# Patient Record
Sex: Female | Born: 1965 | Race: White | Hispanic: No | Marital: Married | Smoking: Never smoker
Health system: Southern US, Community
[De-identification: ages and names within clinical notes are randomized; demographics above are authoritative.]

## PROBLEM LIST (undated history)

## (undated) HISTORY — PX: ROTATOR CUFF REPAIR: SHX139

---

## 2003-01-02 ENCOUNTER — Other Ambulatory Visit: Admission: RE | Admit: 2003-01-02 | Discharge: 2003-01-02 | Payer: Self-pay | Admitting: Obstetrics and Gynecology

## 2003-07-13 ENCOUNTER — Inpatient Hospital Stay (HOSPITAL_COMMUNITY): Admission: RE | Admit: 2003-07-13 | Discharge: 2003-07-16 | Payer: Self-pay | Admitting: Obstetrics and Gynecology

## 2003-07-17 ENCOUNTER — Encounter: Admission: RE | Admit: 2003-07-17 | Discharge: 2003-08-16 | Payer: Self-pay | Admitting: Obstetrics and Gynecology

## 2003-08-21 ENCOUNTER — Other Ambulatory Visit: Admission: RE | Admit: 2003-08-21 | Discharge: 2003-08-21 | Payer: Self-pay | Admitting: Obstetrics and Gynecology

## 2005-04-27 ENCOUNTER — Other Ambulatory Visit: Admission: RE | Admit: 2005-04-27 | Discharge: 2005-04-27 | Payer: Self-pay | Admitting: Obstetrics and Gynecology

## 2005-11-06 ENCOUNTER — Inpatient Hospital Stay (HOSPITAL_COMMUNITY): Admission: RE | Admit: 2005-11-06 | Discharge: 2005-11-09 | Payer: Self-pay | Admitting: Obstetrics and Gynecology

## 2005-11-10 ENCOUNTER — Encounter: Admission: RE | Admit: 2005-11-10 | Discharge: 2005-12-10 | Payer: Self-pay | Admitting: Obstetrics and Gynecology

## 2005-12-11 ENCOUNTER — Encounter: Admission: RE | Admit: 2005-12-11 | Discharge: 2006-01-09 | Payer: Self-pay | Admitting: Obstetrics and Gynecology

## 2006-01-10 ENCOUNTER — Encounter: Admission: RE | Admit: 2006-01-10 | Discharge: 2006-01-30 | Payer: Self-pay | Admitting: Obstetrics and Gynecology

## 2006-06-02 ENCOUNTER — Encounter: Admission: RE | Admit: 2006-06-02 | Discharge: 2006-07-01 | Payer: Self-pay | Admitting: Family Medicine

## 2006-12-13 ENCOUNTER — Encounter (INDEPENDENT_AMBULATORY_CARE_PROVIDER_SITE_OTHER): Payer: Self-pay | Admitting: Obstetrics and Gynecology

## 2006-12-13 ENCOUNTER — Ambulatory Visit (HOSPITAL_COMMUNITY): Admission: AD | Admit: 2006-12-13 | Discharge: 2006-12-13 | Payer: Self-pay | Admitting: Obstetrics and Gynecology

## 2008-08-07 ENCOUNTER — Inpatient Hospital Stay (HOSPITAL_COMMUNITY): Admission: RE | Admit: 2008-08-07 | Discharge: 2008-08-10 | Payer: Self-pay | Admitting: Obstetrics and Gynecology

## 2008-08-07 ENCOUNTER — Encounter (INDEPENDENT_AMBULATORY_CARE_PROVIDER_SITE_OTHER): Payer: Self-pay | Admitting: Obstetrics and Gynecology

## 2008-08-11 ENCOUNTER — Encounter: Admission: RE | Admit: 2008-08-11 | Discharge: 2008-09-10 | Payer: Self-pay | Admitting: Obstetrics and Gynecology

## 2008-09-07 ENCOUNTER — Ambulatory Visit: Admission: RE | Admit: 2008-09-07 | Discharge: 2008-09-07 | Payer: Self-pay | Admitting: Obstetrics and Gynecology

## 2008-09-11 ENCOUNTER — Encounter: Admission: RE | Admit: 2008-09-11 | Discharge: 2008-10-10 | Payer: Self-pay | Admitting: Obstetrics and Gynecology

## 2008-10-11 ENCOUNTER — Encounter: Admission: RE | Admit: 2008-10-11 | Discharge: 2008-11-10 | Payer: Self-pay | Admitting: Obstetrics and Gynecology

## 2008-11-11 ENCOUNTER — Encounter: Admission: RE | Admit: 2008-11-11 | Discharge: 2008-12-11 | Payer: Self-pay | Admitting: Obstetrics and Gynecology

## 2008-12-12 ENCOUNTER — Encounter: Admission: RE | Admit: 2008-12-12 | Discharge: 2009-01-04 | Payer: Self-pay | Admitting: Obstetrics and Gynecology

## 2010-07-01 LAB — CBC
HCT: 24.8 % — ABNORMAL LOW (ref 36.0–46.0)
Hemoglobin: 8.6 g/dL — ABNORMAL LOW (ref 12.0–15.0)
Hemoglobin: 8.8 g/dL — ABNORMAL LOW (ref 12.0–15.0)
MCHC: 35.4 g/dL (ref 30.0–36.0)
MCV: 93.8 fL (ref 78.0–100.0)
Platelets: 201 10*3/uL (ref 150–400)
RBC: 2.6 MIL/uL — ABNORMAL LOW (ref 3.87–5.11)
RBC: 2.64 MIL/uL — ABNORMAL LOW (ref 3.87–5.11)
RBC: 3.63 MIL/uL — ABNORMAL LOW (ref 3.87–5.11)
WBC: 8.3 10*3/uL (ref 4.0–10.5)
WBC: 9.3 10*3/uL (ref 4.0–10.5)

## 2010-07-01 LAB — RPR: RPR Ser Ql: NONREACTIVE

## 2010-08-05 NOTE — H&P (Signed)
[name], [name]                ACCOUNT NO.:  [id number]   MEDICAL RECORD NO.:  [id number]          PATIENT TYPE:  MAT   LOCATION:  [facility]                          FACILITY:  [facility]   PHYSICIAN:  Dineen Kid. Rana Snare, M.D.    DATE OF BIRTH:  [birth year]   DATE OF ADMISSION:  08/07/2008  DATE OF DISCHARGE:                              HISTORY & PHYSICAL   HISTORY OF PRESENT ILLNESS:  Stacey Solis is a 45 year old G4, P2, at 3+  weeks gestational age who presents for repeat cesarean section.  Her  estimated date of confinement is Aug 10, 2008.  Her pregnancy has been  complicated by advanced maternal age with normal chorionic villus  sampling.  She has had previous cesarean sections x2.  She desires  repeat cesarean section and tubal ligation.   PAST MEDICAL HISTORY:  Significant for history of cholestasis of  previous pregnancies.   PAST SURGICAL HISTORY:  She has had 2 cesarean sections, an inguinal  hernia repair, repair of her collar bone.   MEDICATIONS:  Prenatal vitamins.   She has no known drug allergies.   LABORATORY EVALUATION:  Normal.   PHYSICAL EXAMINATION:  VITAL SIGNS:  Her blood pressure is 100/60.  HEART:  Regular rate and rhythm.  LUNGS:  Clear to auscultation bilaterally.  ABDOMEN:  Gravid, nontender.  PELVIC:  Cervix is closed, thick and high.  Fetal heart tones are in the  150s.   Ultrasound showed estimated fetal weight of 7 pounds 0 ounces on Jul 26, 2008.   IMPRESSION:  Intrauterine pregnancy at 39+ weeks gestational age,  previous cesarean section, desires repeat.   PLAN:  Repeat low segment transverse cesarean section and bilateral  tubal ligation.  We discussed the risks and benefits of the procedure at  length which include but are not limited to risk of infection, bleeding,  damage to uterus, tubes, ovary, bowel, bladder, fetus, risk of tubal  failure quoted at 07/998.  She gives her informed consent and wishes to  proceed.      Dineen Kid Rana Snare,  M.D.  Electronically Signed     DCL/MEDQ  D:  08/06/2008  T:  08/06/2008  Job:  [id number]

## 2010-08-05 NOTE — Op Note (Signed)
Stacey Solis, Stacey Solis                ACCOUNT NO.:  [id number]   MEDICAL RECORD NO.:  [id number]          PATIENT TYPE:  AMB   LOCATION:  [facility]                          [facility]   PHYSICIAN:  Dineen Kid. Rana Snare, M.D.    DATE OF BIRTH:  [birth year]   DATE OF PROCEDURE:  12/13/2006  DATE OF DISCHARGE:                               OPERATIVE REPORT   PREOPERATIVE DIAGNOSIS:  Intrauterine pregnancy at [redacted] weeks gestational  age with embryonic demise.   POSTOPERATIVE DIAGNOSIS:  Intrauterine pregnancy at [redacted] weeks gestational  age with embryonic demise.   PROCEDURE:  Dilatation and evacuation   SURGEON:  Dineen Kid. Rana Snare, M.D.   ANESTHESIA:  Monitored anesthetic care and paracervical block.   INDICATIONS:  Stacey Solis is a 45 year old G3, P2, at 10-1/[redacted] weeks  gestational age, presents with vaginal bleeding.  Ultrasound shows a 6-  1/2-week embryo with demise.  She desires dilation and evacuation.  She  has been having cramping and bleeding and cervix is dilated to a  fingertip.  Risks and benefits for D and E were discussed at length.  Informed consent was obtained.  She is Rh positive.   DESCRIPTION OF PROCEDURE:  After adequate anesthesia, the patient was  placed in dorsal lithotomy position. She was sterilely prepped and  draped, the bladder was sterilely drained.  Speculum was placed,  tenaculum placed on the anterior lip of the cervix, paracervical block  was placed with 1% Xylocaine and 1:100,000 epinephrine.  A total of 20  mL used.  Uterus was sounded to 10 cm.  The cervix was already  adequately dilated, so an 8-mm suction curette was inserted, products of  conception retrieved, this was performed until a gritty surface was felt  of the endometrial cavity.  Methergine 0.2 mg was given IM with good  general response.  The curette was removed.  A small intracervical  bleeder as noted.  Direct pressure with a sponge forceps was used until  hemostasis was achieved.  Tenaculum was then  removed from the cervix,  noted to be hemostatic.  Speculum was then removed.  The patient was  transferred to the recovery room in stable condition.  Sponge and  instrument count was normal x3.  Estimated blood loss was minimal. The  patient received 30 mg of Toradol postoperatively.   DISPOSITION:  The patient will be discharged home, will follow up in the  office in 2-3 weeks.  Sent home with a routine instruction sheet for D  and C.  Told to return for increased pain, fever, or bleeding.  She was  given a prescription for Methergine 0.2 mg to take 3 times daily for 2  days and doxycycline 100 mg p.o. b.i.d. for 7 days.      Dineen Kid Rana Snare, M.D.  Electronically Signed     DCL/MEDQ  D:  12/13/2006  T:  12/14/2006  Job:  [id number]

## 2010-08-05 NOTE — Op Note (Signed)
Stacey Solis, Stacey Solis                ACCOUNT NO.:  [id number]   MEDICAL RECORD NO.:  [id number]          PATIENT TYPE:  INP   LOCATION:  9117                          [facility]ACILITY:  [facility]   PHYSICIAN:  Dineen Kid. Rana Snare, M.D.    DATE OF BIRTH:  1965-10-25   DATE OF PROCEDURE:  08/07/2008  DATE OF DISCHARGE:                               OPERATIVE REPORT   PREOPERATIVE DIAGNOSES:  1. Intrauterine pregnancy at 39-1/2 gestational age.  2. Previous cesarean section x2 with desired repeat and multiparity      with desired sterility.   POSTOPERATIVE DIAGNOSES:  1. Intrauterine pregnancy at 39-1/2 gestational age.  2. Previous cesarean section x2 with desired repeat and multiparity      with desired sterility.   PROCEDURE:  Repeat low segment transverse cesarean section and bilateral  tubal ligation.   SURGEON:  Dineen Kid. Rana Snare, M.D.   ANESTHESIA:  Spinal.   INDICATIONS:  Ms. Lehrman is 45 year old G4, P2 at 44 weeks, estimated  date of confinement is Jul 31, 2008.  Pregnancy has been complicated by  advanced maternal age with normal chorionic villus sampling.  She also  has multiparity, desires tubal ligation, presents for this today.  The  risks and benefits were discussed and informed consent was obtained.   FINDING AT THE TIME OF SURGERY:  Viable female infant, Apgars were 8 and  9, pH arterial 7.32, weight is 7 pounds 5 ounces.   DESCRIPTION OF PROCEDURE:  After adequate analgesia, the patient was  placed in the supine position left lateral tilt.  She is sterilely  prepped and draped.  Bladder sterilely drained with Foley catheter.  The  previous Pfannenstiel skin incision was elliptically excised.  The  incision was then taken down sharply to fascia, which was incised  transversely, extended superiorly and inferiorly off the bellies rectus  muscle were separated sharply in midline.  Peritoneum was entered  sharply.  Bladder flap created and placed behind the bladder blade.  A  low  segment myotomy incision made down to the infant's vertex.  Infant's  vertex was delivered atraumatically.  The nares and pharynx are  suctioned.  The infant was delivered, cord clamped, cut and handed to  the pediatricians for resuscitation.  Cord blood was obtained.  Placenta  extracted manually.  Uterus was exteriorized, wiped clean with a dry  lap.  The myotomy incision closed in 2 layers, first being running  locking and second being imbricating layer of 0 Monocryl suture.  The  right fallopian tube was identified by the fimbriated end.  A 2 suture  of 0 plain passed through the mesosalpinx, it was doubly ligated.  Central portion of the fallopian tube excised.  A 2-0 silk was then  ligated on the proximal portion of fallopian tube.  The left fallopian  tube was identified by the fimbriated end.  Midportion of the fallopian  tube ligated with 2 sutures of  0 plain suture passed through the  mesosalpinx, central portion excised, the proximal portion was then  ligated with 2-0 silk.  Good hemostasis was achieved.  The  uterus was  placed back into peritoneal cavity and after copious amount of  irrigation, adequate hemostasis was assured.  The patient has desired  plication of the fascia at the base of the umbilicus.  The umbilicus was  grasped with a Allis clamp through the peritoneum and fascia.  A figure-  of-eight of 0 Vicryl was used to plicate this portion of the fascia with  good results noted at the base of umbilicus.  Sutures then cut.  The  peritoneum was then closed with 0 Monocryl suture in a running fashion.  The fascia was then closed with 0 Vicryl in a running fashion with good  approximation and good hemostasis achieved.  Irrigation was applied.  After adequate hemostasis, the Scarpa's fascia and subcuticular tissue  was closed with a 2-0 plain suture.  Skin was then stapled, Steri-Strips  applied.  The patient tolerated the procedure well, was stable, and  transferred to  the recovery room.  Sponge and instrument count was  normal x3.  Estimated blood loss was 750 mL.  The patient received 1 g  of cefotetan preoperatively.      Dineen Kid Rana Snare, M.D.  Electronically Signed     DCL/MEDQ  D:  08/07/2008  T:  08/07/2008  Job:  [id number]

## 2010-08-05 NOTE — Discharge Summary (Signed)
NAMECORTNY, BAMBACH                ACCOUNT NO.:  [id number]   MEDICAL RECORD NO.:  [id number]          PATIENT TYPE:  INP   LOCATION:  9117                          [facility]   PHYSICIAN:  Julio Sicks, N.P.     DATE OF BIRTH:  November 07, 1965   DATE OF ADMISSION:  08/07/2008  DATE OF DISCHARGE:  08/10/2008                               DISCHARGE SUMMARY   ADMITTING DIAGNOSES:  1. Intrauterine pregnancy at 39-1/2 weeks' estimated gestational age.  2. Previous cesarean section x2, desires repeat.  3. Multiparity, desires permanent sterilization.   DISCHARGE DIAGNOSES:  1. Status post low transverse cesarean section.  2. Viable female infant.  3. Permanent sterilization.   PROCEDURES:  1. Repeat low transverse cesarean section.  2. Bilateral tubal ligation.   REASON FOR ADMISSION:  Please see dictated H and P.   HOSPITAL COURSE:  The patient is 45 year old gravida 4, para 2 that was  admitted to [facility] at 39-1/2 weeks' estimated  gestational age for a scheduled cesarean section.  The patient had had 2  previous cesarean deliveries and desired repeat.  Due to multiparity,  the patient also requested permanent sterilization.  On the morning of  admission, the patient was taken to operating room where spinal  anesthesia was administered without difficulty.  A low transverse  incision was made, delivery of a viable female infant, weighing 7 pounds  5 ounces with Apgars of 8 at 1-minute and 9 at 5-minute.  Arterial cord  pH of 7.32.  Bilateral tubal ligation was performed without difficulty.  The patient tolerated the procedure well and was taken to the recovery  room in stable condition.  Later that evening, the patient was  transferred to the Mother Baby Unit.  Approximately 8 o'clock that  evening, the patient did experience some low blood pressure.  The  patient was alert and oriented.  Blood pressure was 80-90/50-60.  Fundus  was firm and nontender.   Laboratory findings showed hemoglobin of 8.6.  On postoperative day #2, the patient was without complaint.  Vital signs  were stable.  Blood pressure was 83/56-89/48.  Abdomen was soft.  Fundus  was firm and nontender.  Abdominal dressing was noted to have a small  amount of drainage noted on the bandage.  Foley had been discontinued  and she was voiding well.  She is ambulating without difficulty.  Repeat  CBC revealed hemoglobin of 8.8.  On postoperative day #2, the patient  was without complaint.  Vital signs remained stable.  She was afebrile.  Fundus was firm and nontender.  Incision was noted to have a scant  amount of drainage noted from the incision.  Ecchymosis was noted  inferior to the incisional site, extending onto the mons veneris.  On  postoperative day #3, the patient was without complaint.  Vital signs  were stable.  She was afebrile.  Ecchymosis was continued.  Staples were  left intact.  Discharge instructions were reviewed and the patient was  later discharged home.   CONDITION ON DISCHARGE:  Stable.   DIET:  Regular as  tolerated.   ACTIVITY:  No heavy lifting, no driving x2 weeks, and no vaginal entry.   FOLLOWUP:  The patient is to follow up in the office in 3 days for  staple removal.  She is to call for temperature greater than 100  degrees, persistent nausea, vomiting, heavy vaginal bleeding and/or  redness or drainage from the incisional site.   DISCHARGE MEDICATIONS:  1. Tylox #30 one p.o. every 4-6 hours p.r.n.  2. Motrin 600 mg every 6 hours.  3. Prenatal vitamins 1 p.o. daily.      Julio Sicks, N.P.     CC/MEDQ  D:  08/27/2008  T:  08/28/2008  Job:  [id number]

## 2011-01-01 LAB — CBC
HCT: 39.2
MCHC: 34.4
MCV: 91.9
Platelets: 234
RDW: 12.3
WBC: 10.2

## 2012-11-02 ENCOUNTER — Other Ambulatory Visit: Payer: Self-pay | Admitting: Obstetrics and Gynecology

## 2012-11-02 DIAGNOSIS — R928 Other abnormal and inconclusive findings on diagnostic imaging of breast: Secondary | ICD-10-CM

## 2012-11-18 ENCOUNTER — Ambulatory Visit
Admission: RE | Admit: 2012-11-18 | Discharge: 2012-11-18 | Disposition: A | Discharge status: Home / Self Care | Payer: BC Managed Care – PPO | Source: Ambulatory Visit | Attending: Obstetrics and Gynecology | Admitting: Obstetrics and Gynecology

## 2012-11-18 ENCOUNTER — Other Ambulatory Visit: Payer: Self-pay | Admitting: Obstetrics and Gynecology

## 2012-11-18 DIAGNOSIS — R928 Other abnormal and inconclusive findings on diagnostic imaging of breast: Secondary | ICD-10-CM

## 2013-06-22 ENCOUNTER — Other Ambulatory Visit: Payer: Self-pay | Admitting: Obstetrics and Gynecology

## 2013-06-22 DIAGNOSIS — N6310 Unspecified lump in the right breast, unspecified quadrant: Secondary | ICD-10-CM

## 2013-06-30 ENCOUNTER — Ambulatory Visit
Admission: RE | Admit: 2013-06-30 | Discharge: 2013-06-30 | Disposition: A | Discharge status: Home / Self Care | Payer: BC Managed Care – PPO | Source: Ambulatory Visit | Attending: Obstetrics and Gynecology | Admitting: Obstetrics and Gynecology

## 2013-06-30 DIAGNOSIS — N6310 Unspecified lump in the right breast, unspecified quadrant: Secondary | ICD-10-CM

## 2013-12-28 ENCOUNTER — Other Ambulatory Visit: Payer: Self-pay | Admitting: Obstetrics and Gynecology

## 2013-12-29 LAB — CYTOLOGY - PAP

## 2014-02-14 ENCOUNTER — Other Ambulatory Visit: Payer: Self-pay | Admitting: Orthopaedic Surgery

## 2014-02-14 DIAGNOSIS — M75101 Unspecified rotator cuff tear or rupture of right shoulder, not specified as traumatic: Secondary | ICD-10-CM

## 2014-04-04 ENCOUNTER — Ambulatory Visit
Admission: RE | Admit: 2014-04-04 | Discharge: 2014-04-04 | Disposition: A | Discharge status: Home / Self Care | Payer: BLUE CROSS/BLUE SHIELD | Source: Ambulatory Visit | Attending: Orthopaedic Surgery | Admitting: Orthopaedic Surgery

## 2014-04-04 DIAGNOSIS — M75101 Unspecified rotator cuff tear or rupture of right shoulder, not specified as traumatic: Secondary | ICD-10-CM

## 2015-07-01 ENCOUNTER — Other Ambulatory Visit: Payer: Self-pay | Admitting: Obstetrics and Gynecology

## 2015-07-01 DIAGNOSIS — N63 Unspecified lump in unspecified breast: Secondary | ICD-10-CM

## 2015-07-09 ENCOUNTER — Ambulatory Visit
Admission: RE | Admit: 2015-07-09 | Discharge: 2015-07-09 | Disposition: A | Discharge status: Home / Self Care | Payer: No Typology Code available for payment source | Source: Ambulatory Visit | Attending: Obstetrics and Gynecology | Admitting: Obstetrics and Gynecology

## 2015-07-09 ENCOUNTER — Other Ambulatory Visit: Payer: Self-pay | Admitting: Obstetrics and Gynecology

## 2015-07-09 ENCOUNTER — Ambulatory Visit
Admission: RE | Admit: 2015-07-09 | Discharge: 2015-07-09 | Disposition: A | Discharge status: Home / Self Care | Payer: BLUE CROSS/BLUE SHIELD | Source: Ambulatory Visit | Attending: Obstetrics and Gynecology | Admitting: Obstetrics and Gynecology

## 2015-07-09 DIAGNOSIS — N63 Unspecified lump in unspecified breast: Secondary | ICD-10-CM

## 2017-10-07 DIAGNOSIS — Z0000 Encounter for general adult medical examination without abnormal findings: Secondary | ICD-10-CM | POA: Diagnosis not present

## 2018-02-08 DIAGNOSIS — Z6829 Body mass index (BMI) 29.0-29.9, adult: Secondary | ICD-10-CM | POA: Diagnosis not present

## 2018-02-08 DIAGNOSIS — Z01419 Encounter for gynecological examination (general) (routine) without abnormal findings: Secondary | ICD-10-CM | POA: Diagnosis not present

## 2018-02-14 DIAGNOSIS — Z1231 Encounter for screening mammogram for malignant neoplasm of breast: Secondary | ICD-10-CM | POA: Diagnosis not present

## 2019-05-01 ENCOUNTER — Other Ambulatory Visit: Payer: Self-pay | Admitting: Obstetrics and Gynecology

## 2019-05-01 DIAGNOSIS — R928 Other abnormal and inconclusive findings on diagnostic imaging of breast: Secondary | ICD-10-CM

## 2019-05-15 ENCOUNTER — Other Ambulatory Visit: Payer: Self-pay

## 2019-05-15 ENCOUNTER — Ambulatory Visit
Admission: RE | Admit: 2019-05-15 | Discharge: 2019-05-15 | Disposition: A | Discharge status: Home / Self Care | Payer: No Typology Code available for payment source | Source: Ambulatory Visit | Attending: Obstetrics and Gynecology | Admitting: Obstetrics and Gynecology

## 2019-05-15 ENCOUNTER — Ambulatory Visit
Admission: RE | Admit: 2019-05-15 | Discharge: 2019-05-15 | Disposition: A | Discharge status: Home / Self Care | Payer: Managed Care, Other (non HMO) | Source: Ambulatory Visit | Attending: Obstetrics and Gynecology | Admitting: Obstetrics and Gynecology

## 2019-05-15 DIAGNOSIS — R928 Other abnormal and inconclusive findings on diagnostic imaging of breast: Secondary | ICD-10-CM

## 2020-03-13 ENCOUNTER — Encounter (HOSPITAL_BASED_OUTPATIENT_CLINIC_OR_DEPARTMENT_OTHER): Payer: Self-pay

## 2020-03-13 ENCOUNTER — Other Ambulatory Visit: Payer: Self-pay

## 2020-03-13 ENCOUNTER — Emergency Department (HOSPITAL_BASED_OUTPATIENT_CLINIC_OR_DEPARTMENT_OTHER)
Admission: EM | Admit: 2020-03-13 | Discharge: 2020-03-13 | Disposition: A | Discharge status: Home / Self Care | Payer: Managed Care, Other (non HMO) | Attending: Emergency Medicine | Admitting: Emergency Medicine

## 2020-03-13 ENCOUNTER — Emergency Department (HOSPITAL_BASED_OUTPATIENT_CLINIC_OR_DEPARTMENT_OTHER): Payer: Managed Care, Other (non HMO)

## 2020-03-13 DIAGNOSIS — U071 COVID-19: Secondary | ICD-10-CM | POA: Diagnosis not present

## 2020-03-13 DIAGNOSIS — J1289 Other viral pneumonia: Secondary | ICD-10-CM | POA: Insufficient documentation

## 2020-03-13 DIAGNOSIS — R509 Fever, unspecified: Secondary | ICD-10-CM | POA: Diagnosis present

## 2020-03-13 NOTE — ED Notes (Addendum)
AMBULATED IN ROOM, TOLERATED VERY WELL, NO SIGNS OF TACHYPNEA OR DYSPNEA NOTED, HR 98-104/MIN, RA POX CONSISTENTLY AT 95-96%

## 2020-03-13 NOTE — ED Triage Notes (Addendum)
Pt states she had +covid test 12/16-states her MD concerned her sats were high 80s low 90s x 2 days-NAD-steady gait

## 2020-03-13 NOTE — ED Notes (Signed)
Took one (1) 500mg  PO Tylenol at 1045hrs. , has not take any Ibuprofen for the past few days.

## 2020-03-13 NOTE — ED Provider Notes (Signed)
[facility] EMERGENCY DEPARTMENT Provider Note   CSN: [id number] Arrival date & time: 03/13/20  1722     History Chief Complaint  Patient presents with  . Covid Positive    Stacey Solis is a 54 y.o. female.  The history is provided by the patient and medical records. No language interpreter was used.     54 year old female previously diagnosed positive for Covid infection on 12/16 sent here by PCP with concerns of hypoxia.  Patient states for the past 9 days she has had fever, chills, body aches, headache, mild decrease in appetite, dry cough, and social shortness of breath.  Symptom has been improving however she noticed that her home pulse oximeter has been reading in the low 90s and occasionally dips into the 80s.  She did discuss this with her PCP and was recommended to come to the ER for further evaluation.  She has been taking over-the-counter medication as well as been prescribed ivermectin and dexamethasone by her doctor.  She also takes azithromycin as well.  She has not been vaccinated for COVID-19.  History reviewed. No pertinent past medical history.  There are no problems to display for this patient.   Past Surgical History:  Procedure Laterality Date  . CESAREAN SECTION    . ROTATOR CUFF REPAIR       OB History   No obstetric history on file.     No family history on file.  Social History   Tobacco Use  . Smoking status: Never Smoker  . Smokeless tobacco: Never Used  Substance Use Topics  . Alcohol use: Yes    Comment: occ  . Drug use: Never    Home Medications Prior to Admission medications   Not on File    Allergies    Patient has no known allergies.  Review of Systems   Review of Systems  All other systems reviewed and are negative.   Physical Exam Updated Vital Signs BP (!) 130/91 (BP Location: Right Arm)   Pulse 94   Temp (!) 100.5 F (38.1 C) (Oral)   Resp 18   Ht 5\' 2"  (1.575 m)   Wt 71.7 kg   LMP  02/29/2020   SpO2 96%   BMI 28.90 kg/m   Physical Exam Vitals and nursing note reviewed.  Constitutional:      General: She is not in acute distress.    Appearance: She is well-developed and well-nourished.     Comments: Patient laying in bed in no acute discomfort.  Speaking in complete sentences.  HENT:     Head: Atraumatic.  Eyes:     Conjunctiva/sclera: Conjunctivae normal.  Cardiovascular:     Rate and Rhythm: Normal rate and regular rhythm.     Pulses: Normal pulses.     Heart sounds: Normal heart sounds.  Pulmonary:     Breath sounds: Rales present. No wheezing or rhonchi.  Abdominal:     Palpations: Abdomen is soft.     Tenderness: There is no abdominal tenderness.  Musculoskeletal:     Cervical back: Neck supple.     Right lower leg: No edema.     Left lower leg: No edema.  Skin:    Findings: No rash.  Neurological:     Mental Status: She is alert and oriented to person, place, and time.  Psychiatric:        Mood and Affect: Mood and affect and mood normal.     ED Results / Procedures / Treatments  Labs (all labs ordered are listed, but only abnormal results are displayed) Labs Reviewed - No data to display  EKG None  Radiology DG Chest Portable 1 View  Result Date: 03/13/2020 CLINICAL DATA:  Shortness of breath and hypoxia. COVID-19 virus infection. EXAM: PORTABLE CHEST 1 VIEW COMPARISON:  None. FINDINGS: The heart size and mediastinal contours are within normal limits. Multifocal ill-defined areas of pulmonary opacity are seen bilateral with peripheral prominence. Mild subsegmental atelectasis or scarring is seen in the left lower lung. No evidence of pleural effusion. IMPRESSION: Ill-defined areas of bilateral pulmonary opacity with peripheral prominence, suspicious for viral pneumonia. Electronically Signed   By: Danae Orleans M.D.   On: 03/13/2020 18:56    Procedures Procedures (including critical care time)  Medications Ordered in ED Medications  - No data to display  ED Course  I have reviewed the triage vital signs and the nursing notes.  Pertinent labs & imaging results that were available during my care of the patient were reviewed by me and considered in my medical decision making (see chart for details).    MDM Rules/Calculators/A&P                          BP (!) 130/91 (BP Location: Right Arm)   Pulse 94   Temp (!) 100.5 F (38.1 C) (Oral)   Resp 18   Ht 5\' 2"  (1.575 m)   Wt 71.7 kg   LMP 02/29/2020   SpO2 96%   BMI 28.90 kg/m   Final Clinical Impression(s) / ED Diagnoses Final diagnoses:  Pneumonia due to COVID-19 virus    Rx / DC Orders ED Discharge Orders    None     7:57 PM Patient with Covid infection ongoing for the past 9 days.  She is technically outside the window for monoclonal antibody.  Today her chest x-ray shows finding consistent with Covid pneumonia.  She does have a low-grade temperature of 100.5.  When ambulating, her O2 sats at 95-96% on room air.  She does not meets criteria for admission.  Recommend patient to continue with her current treatment and give return precaution.  Stacey Solis was evaluated in Emergency Department on 03/13/2020 for the symptoms described in the history of present illness. She was evaluated in the context of the global COVID-19 pandemic, which necessitated consideration that the patient might be at risk for infection with the SARS-CoV-2 virus that causes COVID-19. Institutional protocols and algorithms that pertain to the evaluation of patients at risk for COVID-19 are in a state of rapid change based on information released by regulatory bodies including the CDC and federal and state organizations. These policies and algorithms were followed during the patient's care in the ED.    03/15/2020, PA-C 03/13/20 2001    03/15/20, MD 03/13/20 2138

## 2020-03-13 NOTE — ED Notes (Addendum)
States she has Covid, states she has a test on the 12-16- 2021 which was positive, MD prescribed Ivermectin, she cont to have symptoms of fever, fatigue, body aches, HA, appetite ok, but decreased. Also has been on dexamethasone as well. Pt noted to be alert and oriented, speech WNL, resp appear normal as well. Primary Care is [facility].

## 2020-03-13 NOTE — ED Notes (Signed)
PULSE OX WHILE AMBULATING COMPLETED BY MICHAEL NANNY,RN

## 2020-03-13 NOTE — Discharge Instructions (Signed)
Recommendations for at home COVID-19 symptoms management:  Please continue isolation at home. Call [phone] to see whether you might be eligible for therapeutic antibody infusions (leave your name and they will call you back).  If have acute worsening of symptoms please go to ER/urgent care for further evaluation. Check pulse oximetry and if below 90-92% please go to ER. The following supplements MAY help:  Vitamin C 500mg twice a day and Quercetin 250-500 mg twice a day Vitamin D3 2000 - 4000 u/day B Complex vitamins Zinc 75-100 mg/day Melatonin 6-10 mg at night (the optimal dose is unknown) Aspirin 81mg/day (if no history of bleeding issues)  

## 2021-05-13 IMAGING — MG MM DIGITAL DIAGNOSTIC UNILAT*L* W/ TOMO W/ CAD
4 series · 4 of 12 positions shown · non-contrast
Comparison: Previous exam(s).

CLINICAL DATA: 53-year-old female presenting as a recall from
screen for possible left breast mass.

EXAM:
DIGITAL DIAGNOSTIC LEFT MAMMOGRAM WITH TOMO
ULTRASOUND LEFT BREAST

[L MLO synth-2D]
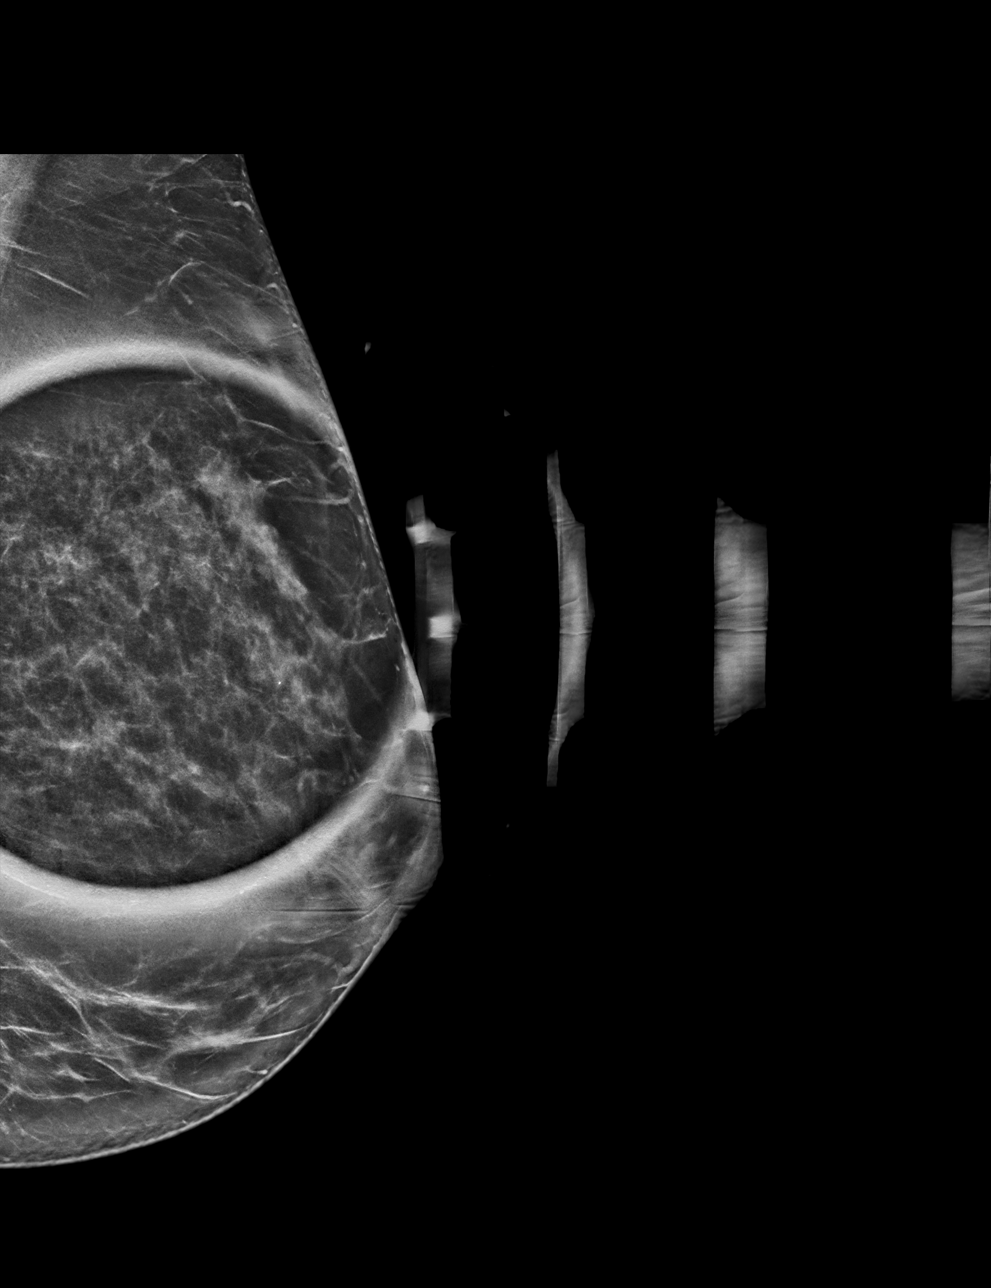

[L CC synth-2D]
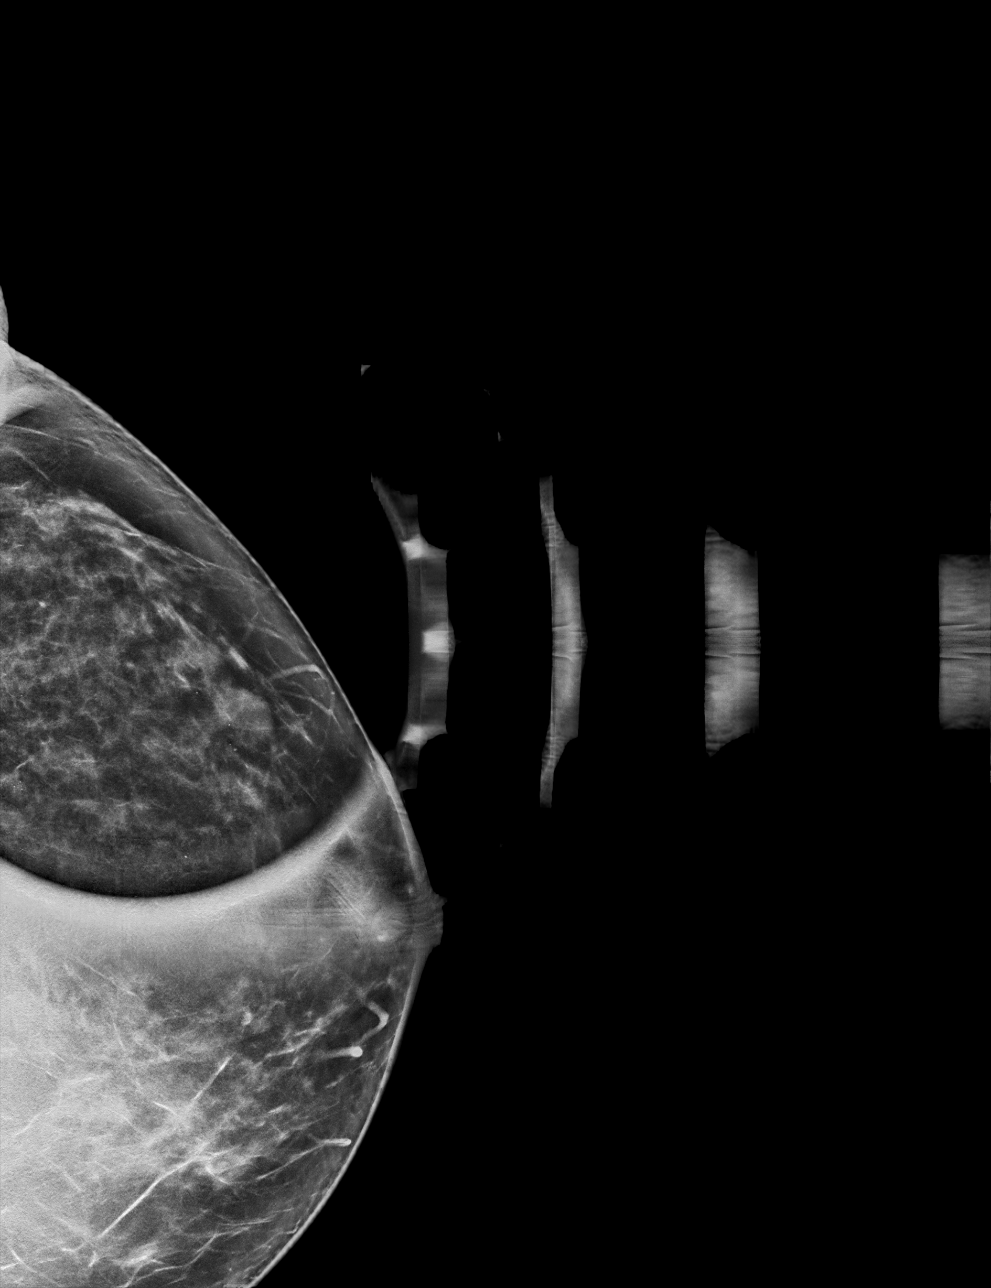

[L MLO tomo · tomo slice 39/76.0]
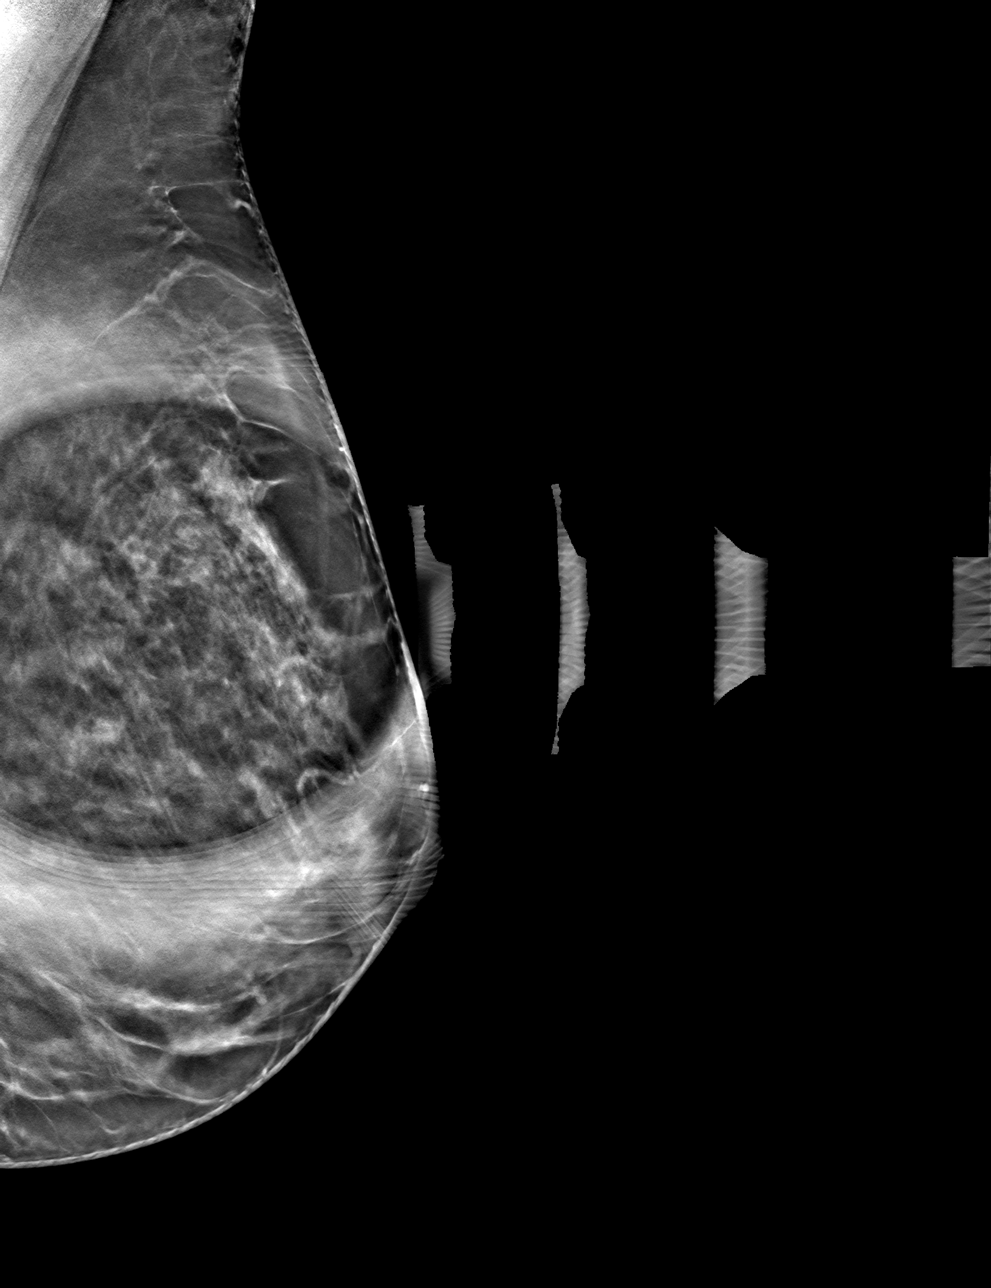

[L CC tomo · tomo slice 35/69.0]
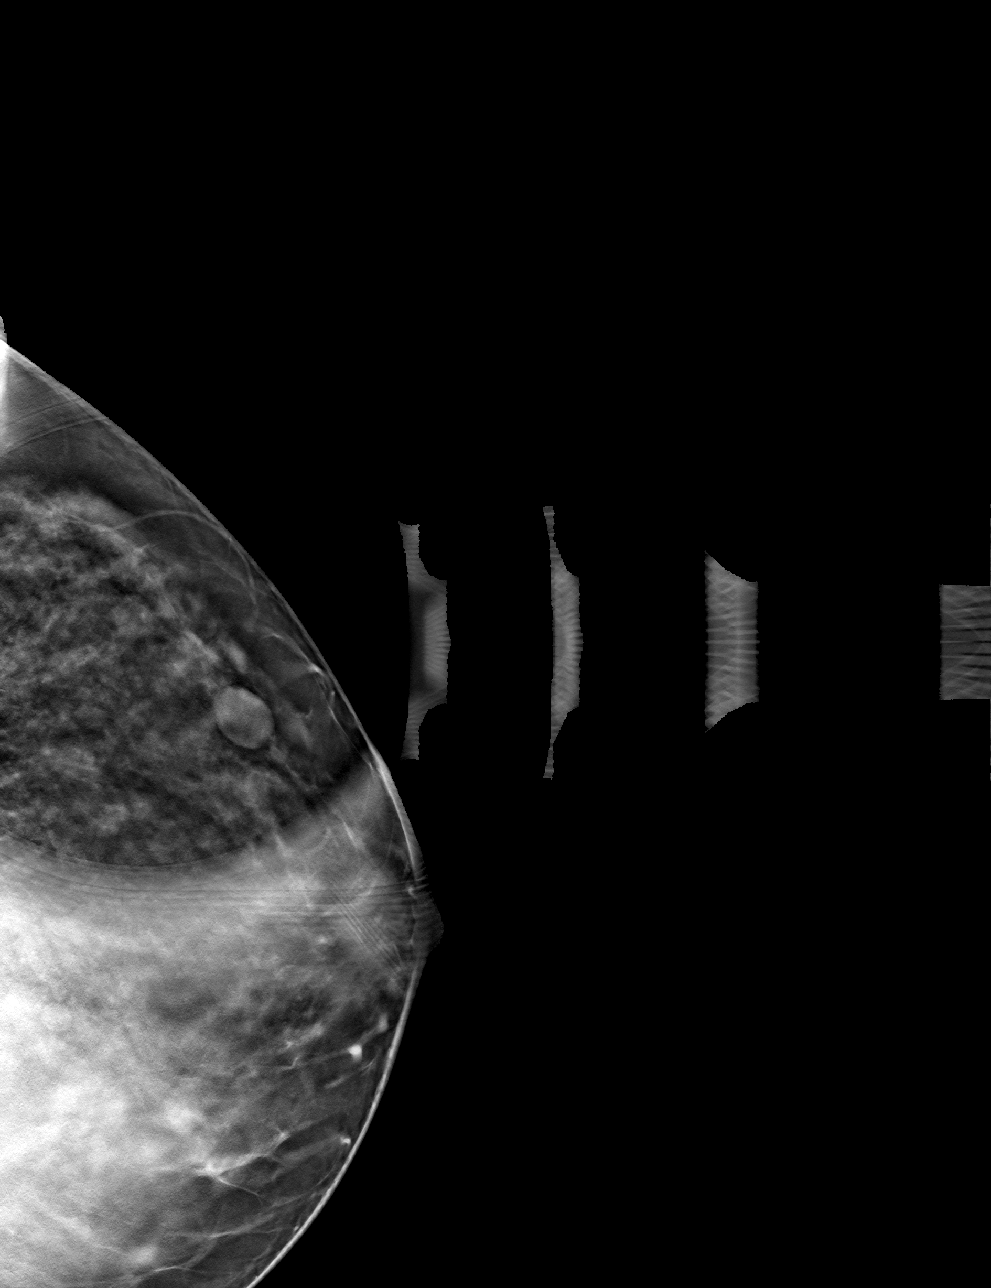

[4 of 12 positions shown; findings below may reference images not displayed]

ACR Breast Density Category c: The breast tissue is heterogeneously
dense, which may obscure small masses.
FINDINGS: Mammogram:

Additional spot compression tomosynthesis views were performed for
the questioned mass in the upper outer left breast. On the
additional imaging there is persistence of an oval circumscribed
mass measuring approximately 1.1 cm.

Ultrasound:

Targeted ultrasound is performed in the left breast at 2:30 o'clock
2 cm from the nipple demonstrating an oval circumscribed anechoic
mass with posterior enhancement measuring 1.0 x 0.7 x 1.0 cm,
consistent with a simple cyst. No internal blood flow identified.
This corresponds to the mammographic finding.
IMPRESSION: Left breast mass at 2 o'clock measuring 1.0 cm consistent with a
benign simple cyst. No mammographic or sonographic evidence of
malignancy.

RECOMMENDATION:
Screening mammogram in one year.(Code:0D-E-MNI)

I have discussed the findings and recommendations with the patient.
If applicable, a reminder letter will be sent to the patient
regarding the next appointment.

BI-RADS CATEGORY  2: Benign.

## 2021-05-13 IMAGING — US US BREAST*L* LIMITED INC AXILLA
1 series · 5 of 5 positions shown · non-contrast
Comparison: Previous exam(s).

CLINICAL DATA: 53-year-old female presenting as a recall from
screen for possible left breast mass.

EXAM:
DIGITAL DIAGNOSTIC LEFT MAMMOGRAM WITH TOMO
ULTRASOUND LEFT BREAST

[Series 1: us breast*left* limited inc axilla · 0.06mm/px · 5 of 5 slices shown]
[im 1/5]
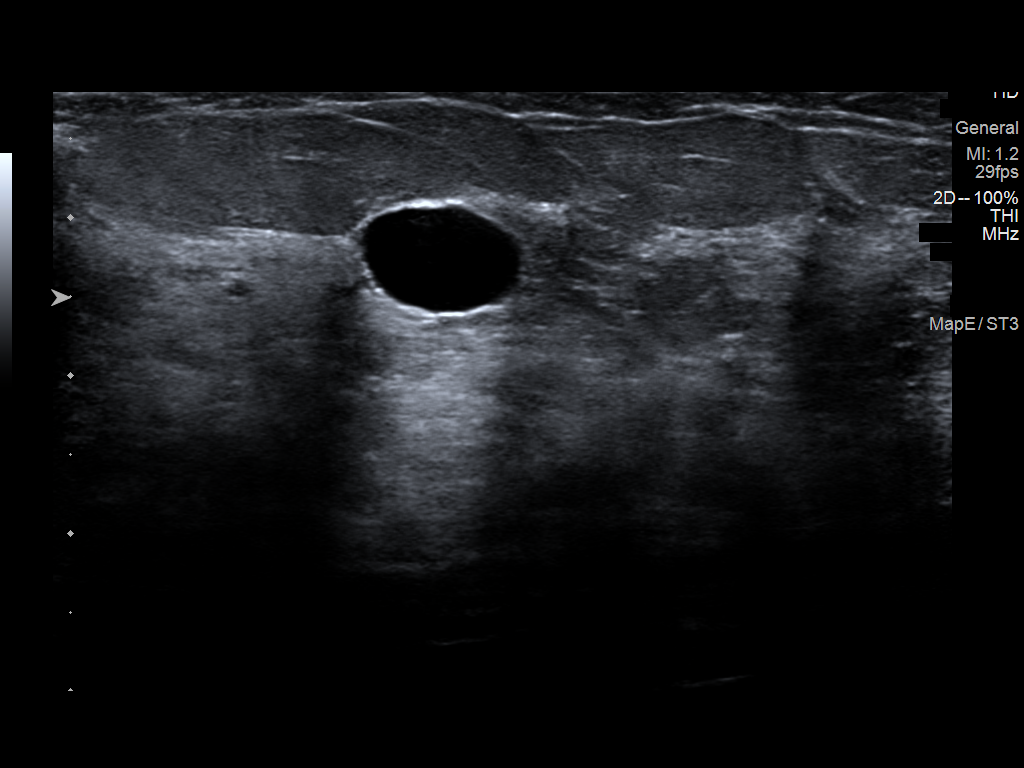
[im 2/5]
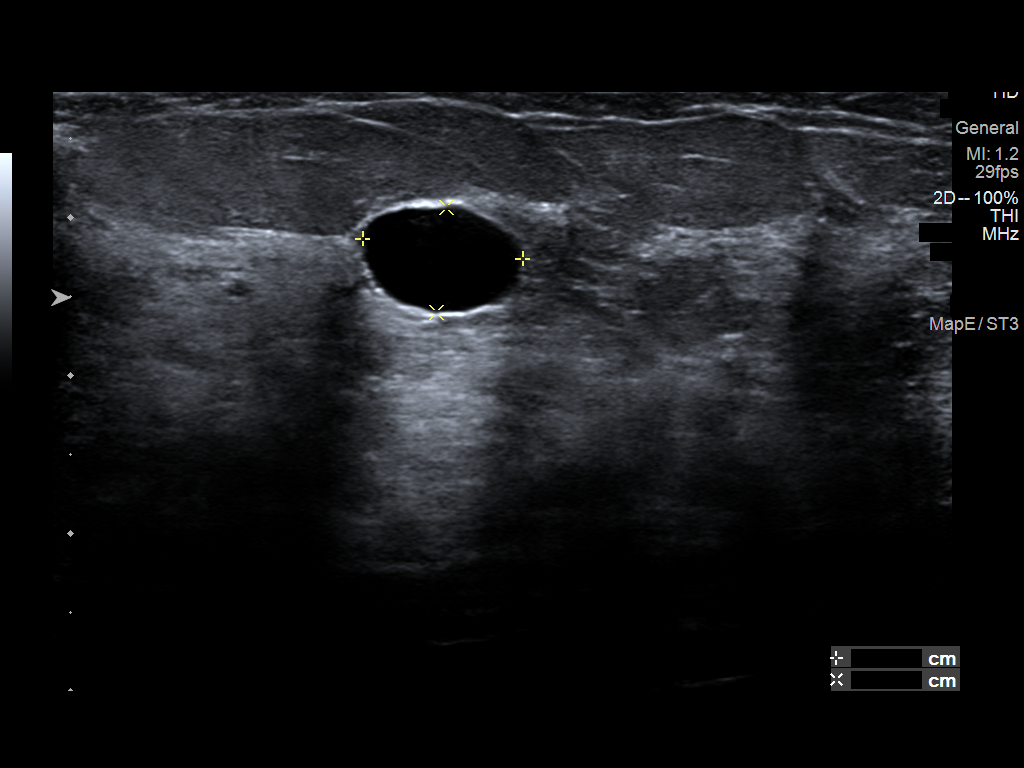
[im 3/5]
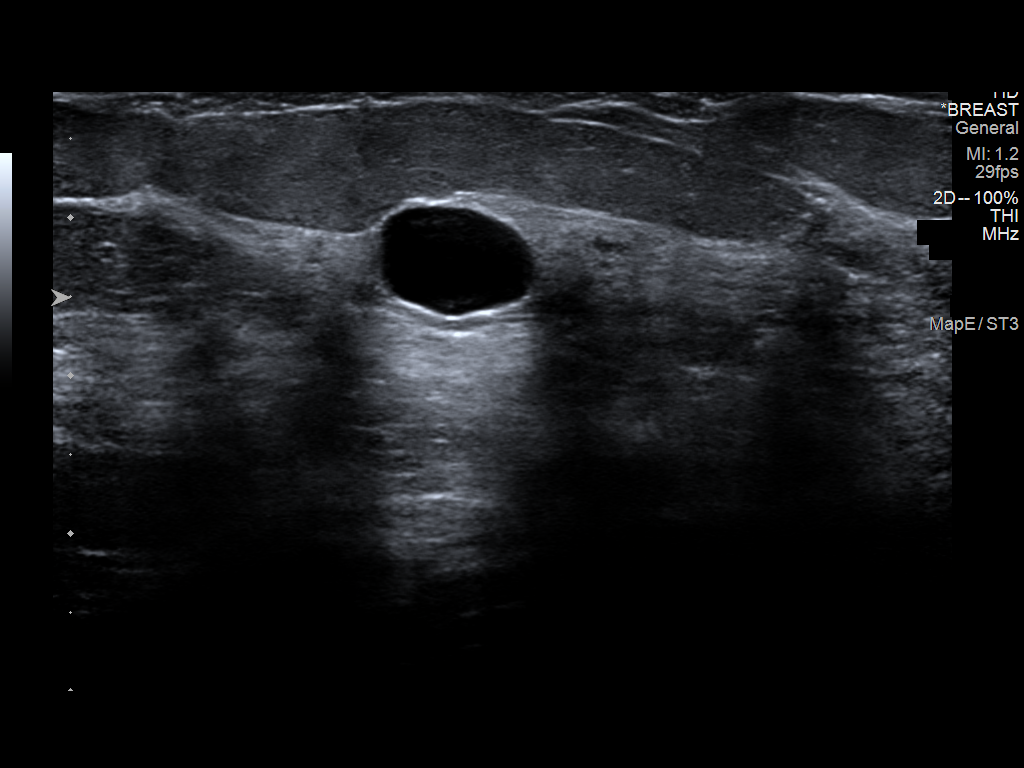
[im 4/5]
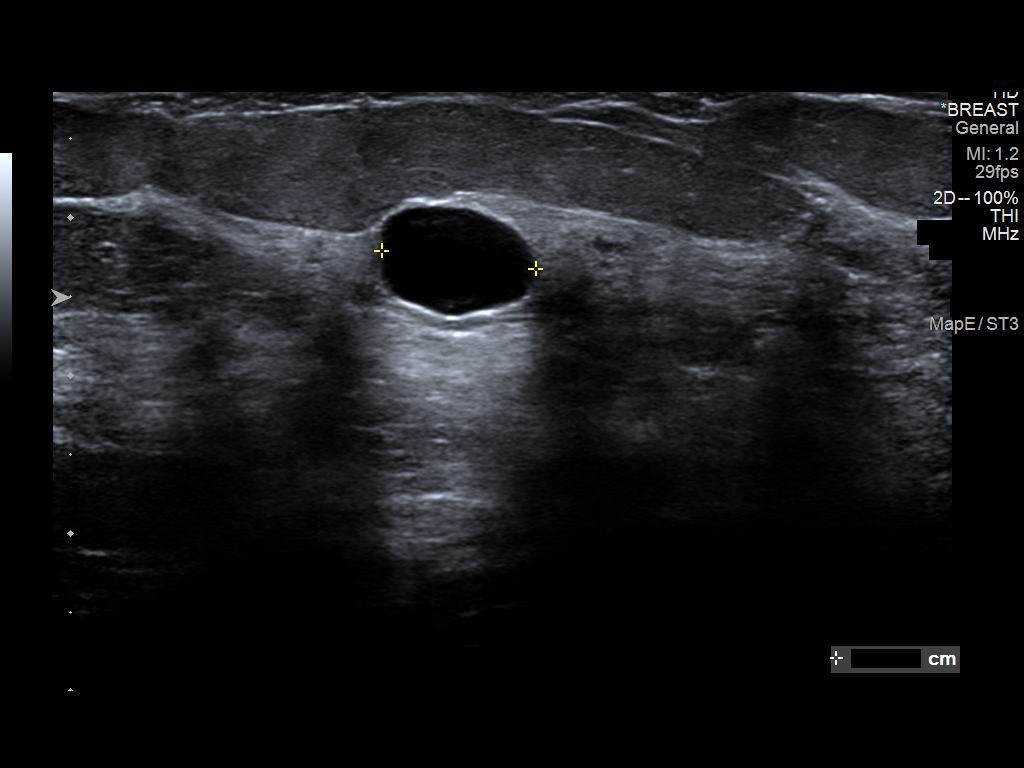
[im 5/5]
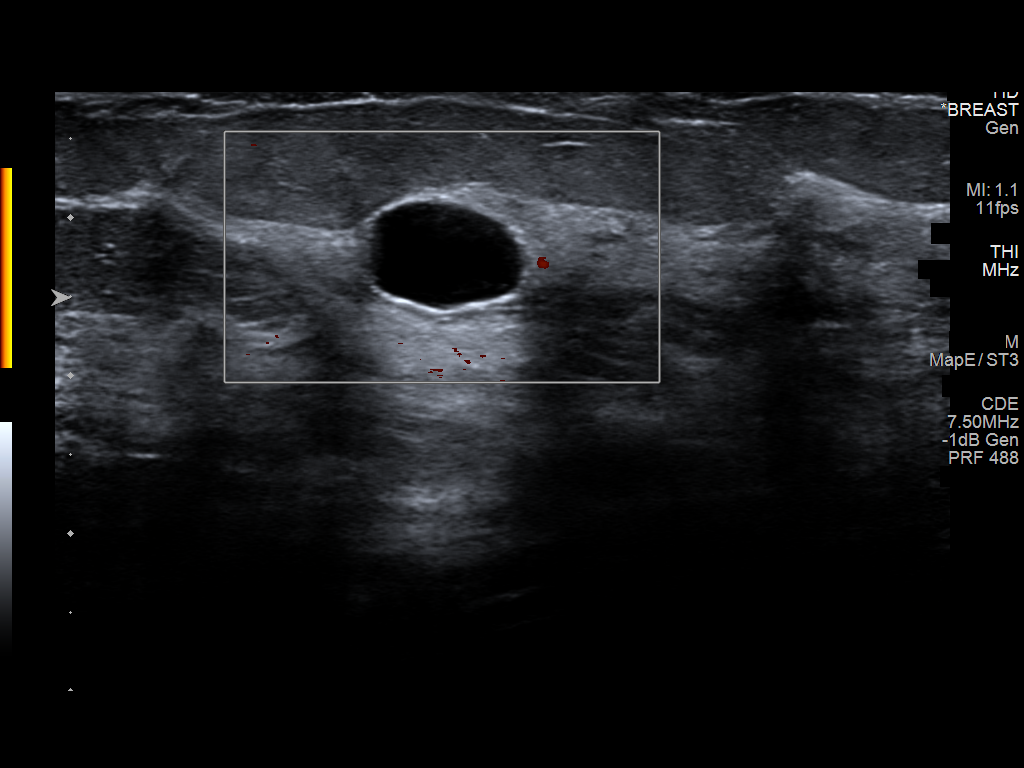

[5 of 5 positions shown; findings below may reference images not displayed]

ACR Breast Density Category c: The breast tissue is heterogeneously
dense, which may obscure small masses.
FINDINGS: Mammogram:

Additional spot compression tomosynthesis views were performed for
the questioned mass in the upper outer left breast. On the
additional imaging there is persistence of an oval circumscribed
mass measuring approximately 1.1 cm.

Ultrasound:

Targeted ultrasound is performed in the left breast at 2:30 o'clock
2 cm from the nipple demonstrating an oval circumscribed anechoic
mass with posterior enhancement measuring 1.0 x 0.7 x 1.0 cm,
consistent with a simple cyst. No internal blood flow identified.
This corresponds to the mammographic finding.
IMPRESSION: Left breast mass at 2 o'clock measuring 1.0 cm consistent with a
benign simple cyst. No mammographic or sonographic evidence of
malignancy.

RECOMMENDATION:
Screening mammogram in one year.(Code:0D-E-MNI)

I have discussed the findings and recommendations with the patient.
If applicable, a reminder letter will be sent to the patient
regarding the next appointment.

BI-RADS CATEGORY  2: Benign.
